# Patient Record
Sex: Male | Born: 1995 | Race: White | Hispanic: No | Marital: Single | Smoking: Never smoker
Health system: Southern US, Community
[De-identification: ages and names within clinical notes are randomized; demographics above are authoritative.]

---

## 2017-01-02 ENCOUNTER — Emergency Department: Payer: Commercial Managed Care - PPO

## 2017-01-02 ENCOUNTER — Emergency Department
Admission: EM | Admit: 2017-01-02 | Discharge: 2017-01-02 | Disposition: A | Discharge status: Home / Self Care | Payer: Commercial Managed Care - PPO | Attending: Emergency Medicine | Admitting: Emergency Medicine

## 2017-01-02 DIAGNOSIS — R197 Diarrhea, unspecified: Secondary | ICD-10-CM | POA: Insufficient documentation

## 2017-01-02 DIAGNOSIS — R5381 Other malaise: Secondary | ICD-10-CM | POA: Diagnosis not present

## 2017-01-02 DIAGNOSIS — R1084 Generalized abdominal pain: Secondary | ICD-10-CM | POA: Insufficient documentation

## 2017-01-02 DIAGNOSIS — R112 Nausea with vomiting, unspecified: Secondary | ICD-10-CM | POA: Diagnosis present

## 2017-01-02 LAB — CBC
HCT: 41.9 % (ref 40.0–52.0)
Hemoglobin: 13.9 g/dL (ref 13.0–18.0)
MCH: 25.9 pg — AB (ref 26.0–34.0)
MCHC: 33.2 g/dL (ref 32.0–36.0)
MCV: 78 fL — AB (ref 80.0–100.0)
PLATELETS: 524 10*3/uL — AB (ref 150–440)
RBC: 5.37 MIL/uL (ref 4.40–5.90)
RDW: 12.8 % (ref 11.5–14.5)
WBC: 15.1 10*3/uL — ABNORMAL HIGH (ref 3.8–10.6)

## 2017-01-02 LAB — LIPASE, BLOOD: LIPASE: 17 U/L (ref 11–51)

## 2017-01-02 LAB — URINALYSIS, COMPLETE (UACMP) WITH MICROSCOPIC
Bacteria, UA: NONE SEEN
Bilirubin Urine: NEGATIVE
Glucose, UA: NEGATIVE mg/dL
Hgb urine dipstick: NEGATIVE
KETONES UR: NEGATIVE mg/dL
Leukocytes, UA: NEGATIVE
Nitrite: NEGATIVE
PH: 6 (ref 5.0–8.0)
PROTEIN: NEGATIVE mg/dL
SQUAMOUS EPITHELIAL / LPF: NONE SEEN
Specific Gravity, Urine: 1.018 (ref 1.005–1.030)

## 2017-01-02 LAB — COMPREHENSIVE METABOLIC PANEL
ALBUMIN: 4.3 g/dL (ref 3.5–5.0)
ALK PHOS: 100 U/L (ref 38–126)
ALT: 27 U/L (ref 17–63)
AST: 29 U/L (ref 15–41)
Anion gap: 8 (ref 5–15)
BUN: 17 mg/dL (ref 6–20)
CALCIUM: 9.8 mg/dL (ref 8.9–10.3)
CHLORIDE: 104 mmol/L (ref 101–111)
CO2: 26 mmol/L (ref 22–32)
CREATININE: 1.15 mg/dL (ref 0.61–1.24)
GFR calc non Af Amer: >60 mL/min (ref >60)
GLUCOSE: 96 mg/dL (ref 65–99)
Potassium: 4.2 mmol/L (ref 3.5–5.1)
SODIUM: 138 mmol/L (ref 135–145)
Total Bilirubin: 0.6 mg/dL (ref 0.3–1.2)
Total Protein: 8.2 g/dL — ABNORMAL HIGH (ref 6.5–8.1)

## 2017-01-02 LAB — MONONUCLEOSIS SCREEN: Mono Screen: NEGATIVE

## 2017-01-02 MED ORDER — IOPAMIDOL (ISOVUE-300) INJECTION 61%
100.0000 mL | Freq: Once | INTRAVENOUS | Status: AC | PRN
  Administered 2017-01-02: 100 mL via INTRAVENOUS

## 2017-01-02 MED ORDER — IOPAMIDOL (ISOVUE-300) INJECTION 61%
30.0000 mL | Freq: Once | INTRAVENOUS | Status: AC | PRN
  Administered 2017-01-02: 30 mL via ORAL

## 2017-01-02 MED ORDER — SODIUM CHLORIDE 0.9 % IV BOLUS (SEPSIS)
1000.0000 mL | Freq: Once | INTRAVENOUS | Status: AC
  Administered 2017-01-02: 1000 mL via INTRAVENOUS

## 2017-01-02 MED ORDER — ONDANSETRON HCL 4 MG/2ML IJ SOLN
4.0000 mg | Freq: Once | INTRAMUSCULAR | Status: AC
  Administered 2017-01-02: 4 mg via INTRAVENOUS
  Filled 2017-01-02: qty 2

## 2017-01-02 MED ORDER — ONDANSETRON HCL 4 MG PO TABS: 4.0000 mg | ORAL_TABLET | Freq: Every day | ORAL | 0 refills | Status: AC | PRN

## 2017-01-02 MED ORDER — ONDANSETRON HCL 4 MG PO TABS: 4.0000 mg | ORAL_TABLET | Freq: Once | ORAL | Status: DC

## 2017-01-02 NOTE — ED Triage Notes (Addendum)
Pt reports nausea and abdominal pain X 1 week, generalized fatigue. Pt alert and oriented X4, active, cooperative, pt in NAD. RR even and unlabored, color WNL.  Nausea and fatigue worse after eating. PO intake normal.

## 2017-01-02 NOTE — ED Notes (Signed)
Patient up to restroom with no assistance. Steady gait noted. Will continue to monitor.

## 2017-01-02 NOTE — ED Provider Notes (Signed)
[facility] Emergency Department Provider Note  ____________________________________________   None    (approximate)  I have reviewed the triage vital signs and the nursing notes.   HISTORY  Chief Complaint Abdominal Pain and Nausea   HPI Alejandro Gross is a 21 y.o. male who is presenting to the emergency department with 1 week of nausea, fatigue as well as abdominal and right testicular pain. He says that he has been feeling increasingly weak without any focal weakness. Says that after eating now he becomes nauseous and then vomits. He says that he has intermittent abdominal pain but there are shooting pains that last for seconds. He says the pain is in his right testicle. He says that he had had similar pains this past fall [facility] and was diagnosed with epididymitis. He denies any urinary symptoms at this time. Denies any sore throat, body aches or fever.  Says that when the abdominal pain does occur it is to the bilateral lower abdomen.   History reviewed. No pertinent past medical history.  There are no active problems to display for this patient.   History reviewed. No pertinent surgical history.  Prior to Admission medications   Not on File    Allergies Patient has no known allergies.  No family history on file.  Social History Social History  Substance Use Topics  . Smoking status: Never Smoker  . Smokeless tobacco: Never Used  . Alcohol use Yes    Review of Systems Constitutional: No fever/chills Eyes: No visual changes. ENT: No sore throat. Cardiovascular: Denies chest pain. Respiratory: Denies shortness of breath. Gastrointestinal:   No diarrhea.  No constipation. Genitourinary: Negative for dysuria. Musculoskeletal: Negative for back pain. Skin: Negative for rash. Neurological: Negative for headaches, focal weakness or numbness.  10-point ROS otherwise  negative.  ____________________________________________   PHYSICAL EXAM:  VITAL SIGNS: ED Triage Vitals  Enc Vitals Group     BP 01/02/17 1238 (!) 141/72     Pulse Rate 01/02/17 1238 64     Resp 01/02/17 1238 12     Temp 01/02/17 1238 98.2 F (36.8 C)     Temp Source 01/02/17 1238 Oral     SpO2 01/02/17 1238 99 %     Weight 01/02/17 1238 180 lb (81.6 kg)     Height 01/02/17 1238 5\' 8"  (1.727 m)     Head Circumference --      Peak Flow --      Pain Score 01/02/17 1243 0     Pain Loc --      Pain Edu? --      Excl. in GC? --     Constitutional: Alert and oriented. Well appearing and in no acute distress. Eyes: Conjunctivae are normal. PERRL. EOMI. Head: Atraumatic. Nose: No congestion/rhinnorhea. Mouth/Throat: Mucous membranes are moist.  Small amount of exudate to the tonsils, bilaterally. No swelling or erythema. Neck: No stridor.   Cardiovascular: Normal rate, regular rhythm. Grossly normal heart sounds.  Respiratory: Normal respiratory effort.  No retractions. Lungs CTAB. Gastrointestinal: Soft and nontender. No distention. No abdominal bruits. No CVA tenderness. Genitourinary: Normal external genitalia in this circumcised male. No tenderness to palpation to the testes or epididymitis, bilaterally. No swelling or induration. No hernia sacs palpated. Musculoskeletal: No lower extremity tenderness nor edema.  No joint effusions. Neurologic:  Normal speech and language. No gross focal neurologic deficits are appreciated. No gait instability. Skin:  Skin is warm, dry and intact. No rash noted.  Psychiatric: Mood and affect are normal. Speech and behavior are normal.  ____________________________________________   LABS (all labs ordered are listed, but only abnormal results are displayed)  Labs Reviewed  COMPREHENSIVE METABOLIC PANEL - Abnormal; Notable for the following:       Result Value   Total Protein 8.2 (*)    All other components within normal limits  CBC -  Abnormal; Notable for the following:    WBC 15.1 (*)    MCV 78.0 (*)    MCH 25.9 (*)    Platelets 524 (*)    All other components within normal limits  URINALYSIS, COMPLETE (UACMP) WITH MICROSCOPIC - Abnormal; Notable for the following:    Color, Urine YELLOW (*)    APPearance CLEAR (*)    All other components within normal limits  LIPASE, BLOOD  MONONUCLEOSIS SCREEN   ____________________________________________  EKG   ____________________________________________  RADIOLOGY    CT Abdomen Pelvis W Contrast (Final result)  Result time 01/02/17 17:15:51  Final result by Myles Rosenthal, MD (01/02/17 17:15:51)           Narrative:   CLINICAL DATA: Abdominal pain and nausea for 1 week. Leukocytosis.  EXAM: CT ABDOMEN AND PELVIS WITH CONTRAST  TECHNIQUE: Multidetector CT imaging of the abdomen and pelvis was performed using the standard protocol following bolus administration of intravenous contrast.  CONTRAST: ISOVUE-300 IOPAMIDOL (ISOVUE-300) INJECTION 61%  COMPARISON: None.  FINDINGS: Lower Chest: No acute findings.  Hepatobiliary: No masses identified. Gallbladder is unremarkable.  Pancreas: No mass or inflammatory changes.  Spleen: Within normal limits in size and appearance.  Adrenals/Urinary Tract: No masses identified. No evidence of hydronephrosis.  Stomach/Bowel: No evidence of obstruction, inflammatory process or abnormal fluid collections. Normal appendix visualized.  Vascular/Lymphatic: No pathologically enlarged lymph nodes. No abdominal aortic aneurysm.  Reproductive: No mass identified.  Other: None.  Musculoskeletal: No suspicious bone lesions identified.  IMPRESSION: Negative. No acute findings or other significant abnormality identified.   Electronically Signed By: Myles Rosenthal M.D. On: 01/02/2017 17:15          ____________________________________________   PROCEDURES  Procedure(s) performed:    Procedures  Critical Care performed:   ____________________________________________   INITIAL IMPRESSION / ASSESSMENT AND PLAN / ED COURSE  Pertinent labs & imaging results that were available during my care of the patient were reviewed by me and considered in my medical decision making (see chart for details).  ----------------------------------------- 6:22 PM on 01/02/2017 -----------------------------------------  Patient with a CAT scan without any acute findings. After Zofran the patient has been able to tolerate his by mouth contrast. Has also had one episode of diarrhea in the emergency department. Reassuring workup except for his white blood cell count of 15. Possible viral etiology. Symptoms have been ongoing for a week. Now with nausea vomiting and diarrhea. He'll be discharged with Zofran. He'll be following up at the [facility] clinic. He is understanding the plan and willing to comply. Denies any known tick bites or fevers.      ____________________________________________   FINAL CLINICAL IMPRESSION(S) / ED DIAGNOSES  Malaise. Abdominal pain. Nausea vomiting and diarrhea.    NEW MEDICATIONS STARTED DURING THIS VISIT:  New Prescriptions   No medications on file     Note:  This document was prepared using Dragon voice recognition software and may include unintentional dictation errors.    Myrna Blazer, MD 01/02/17 [id number]

## 2017-08-01 IMAGING — CT CT ABD-PELV W/ CM
2 of 4 series · 16 of 46 positions shown, 18 images · IV contrast (iopamidol)
Comparison: None.

CLINICAL DATA: Abdominal pain and nausea for 1 week.  Leukocytosis.

EXAM:
CT ABDOMEN AND PELVIS WITH CONTRAST
TECHNIQUE: Multidetector CT imaging of the abdomen and pelvis was performed
using the standard protocol following bolus administration of
intravenous contrast.
CONTRAST:  100mL [id number] IOPAMIDOL ([id number]) INJECTION 61%

[Series 2: routine abd/pel with · axial · 0.79mm/px · z∈[-943,-518]mm · 13 of 93 slices shown, 15 images]
[im 4/93  soft-tissue]
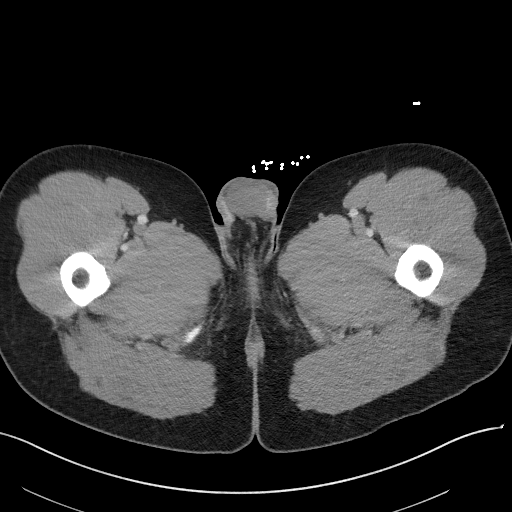
[im 4/93  bone]
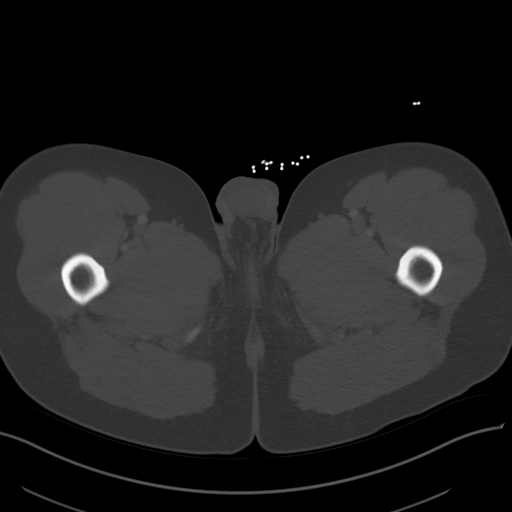
[im 12/93  soft-tissue]
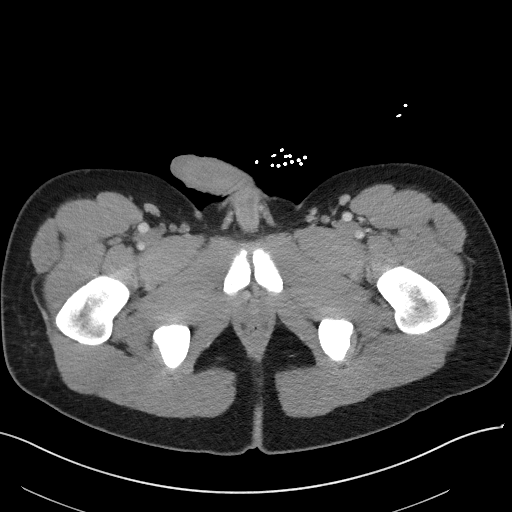
[im 20/93  soft-tissue]
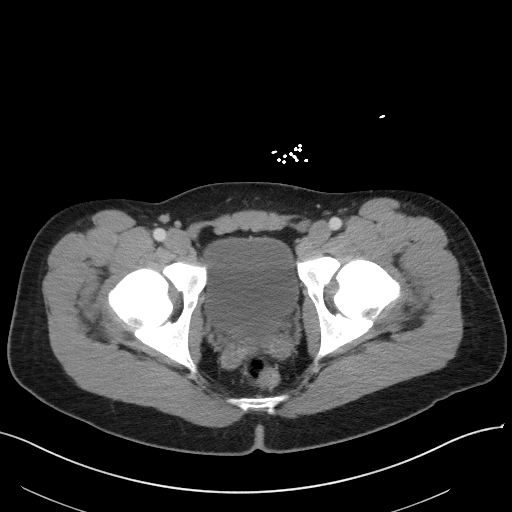
[im 27/93  soft-tissue]
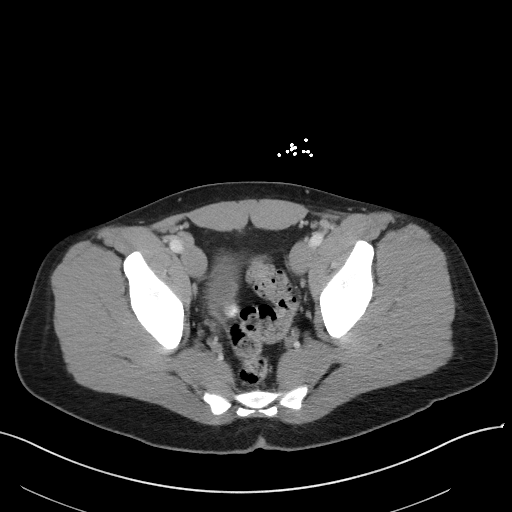
[im 31/93  soft-tissue]
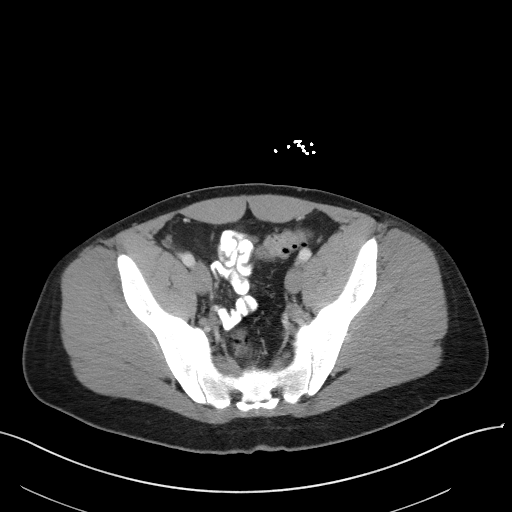
[im 39/93  soft-tissue]
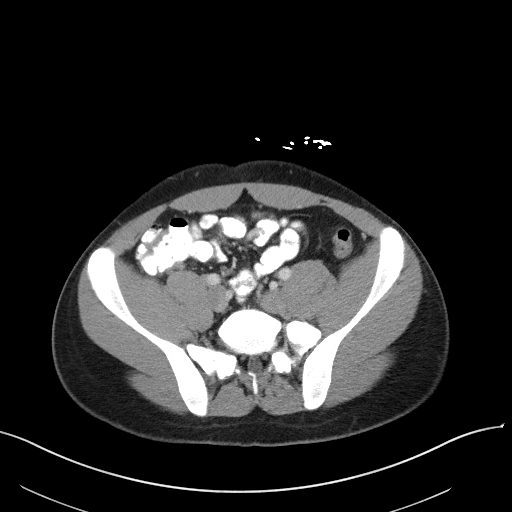
[im 47/93  soft-tissue]
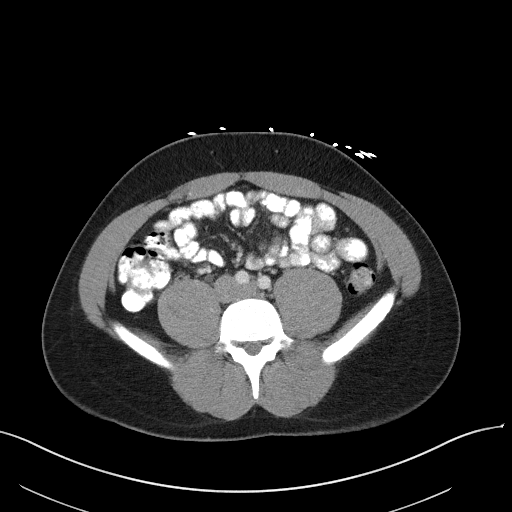
[im 54/93  soft-tissue]
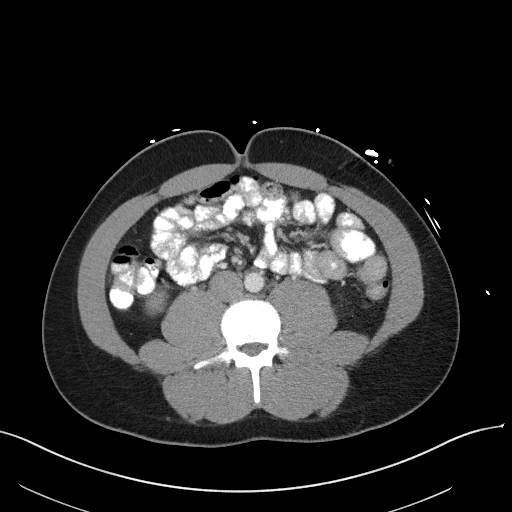
[im 62/93  soft-tissue]
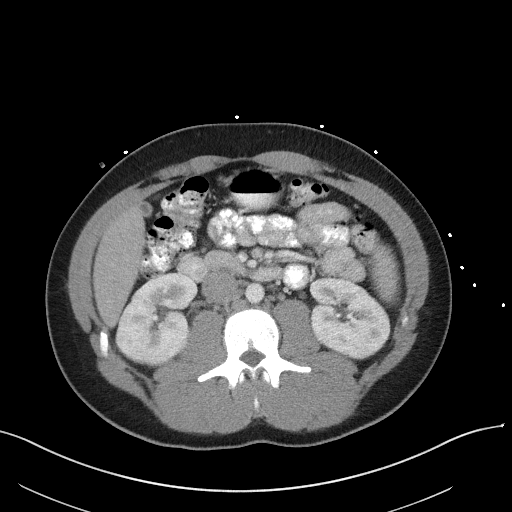
[im 62/93  bone]
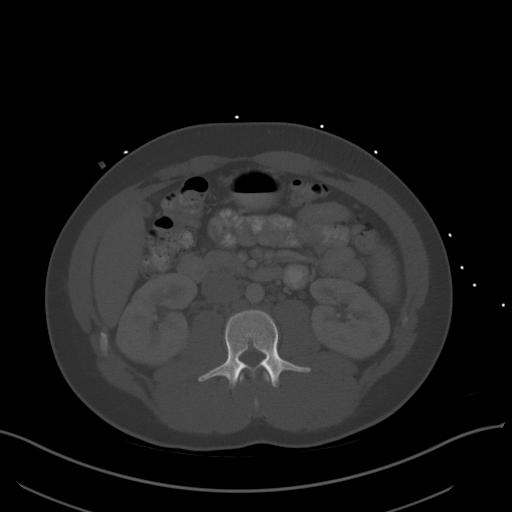
[im 66/93  soft-tissue]
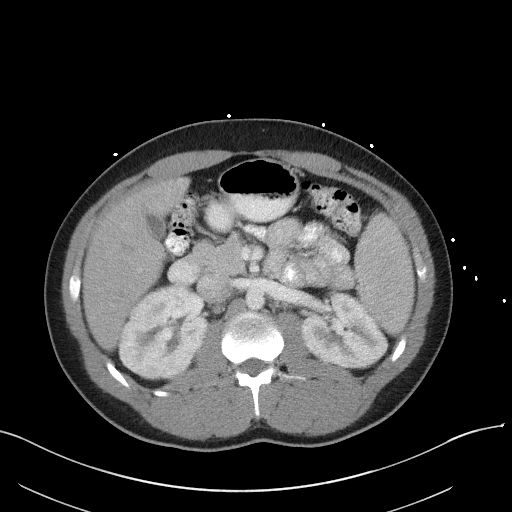
[im 73/93  soft-tissue]
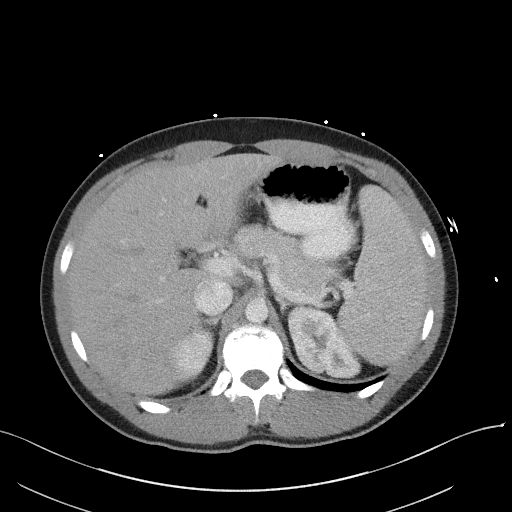
[im 81/93  soft-tissue]
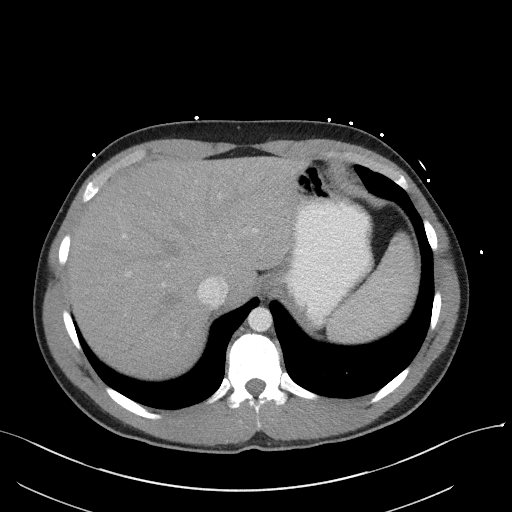
[im 89/93  soft-tissue]
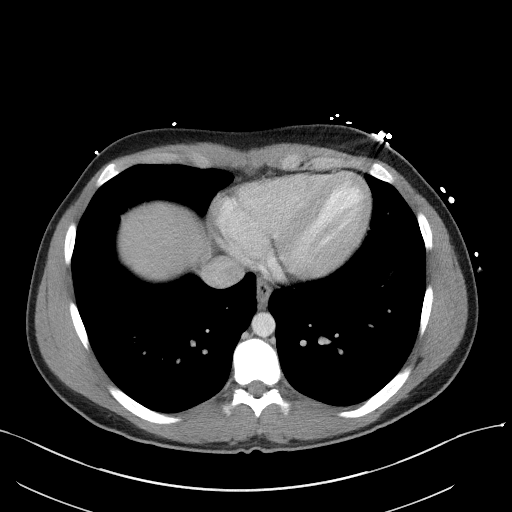

[Series 5: coronal st · coronal · 0.68mm/px · 3 of 101 slices shown]
[im 34/101  soft-tissue]
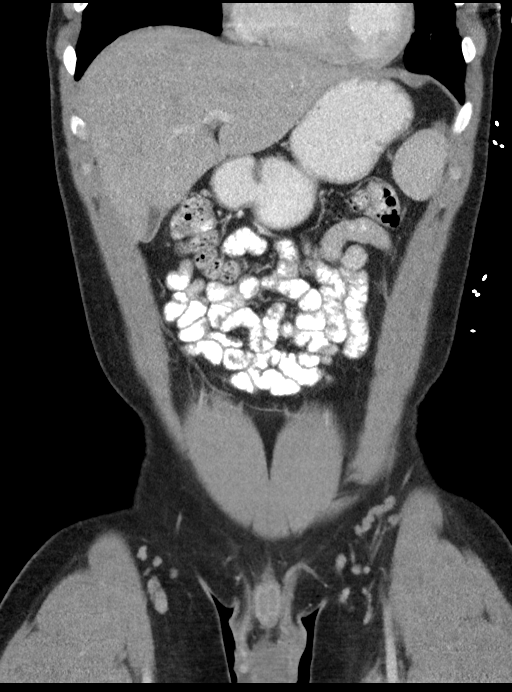
[im 45/101  soft-tissue]
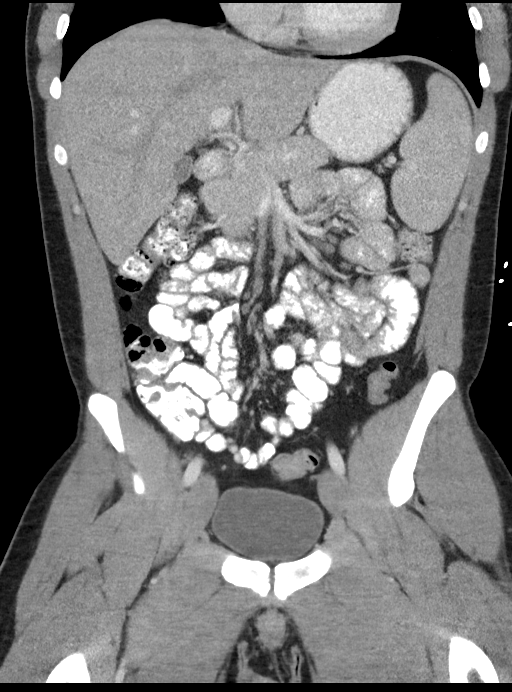
[im 56/101  soft-tissue]
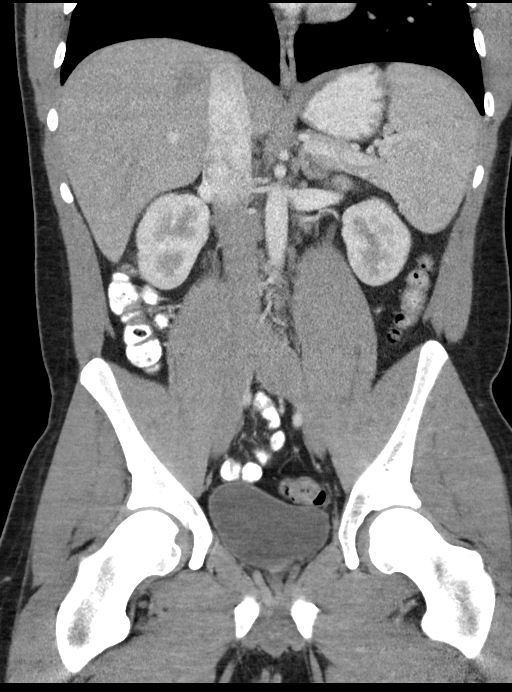

[16 of 46 positions shown; findings below may reference images not displayed]

FINDINGS: Lower Chest: No acute findings.

Hepatobiliary:  No masses identified. Gallbladder is unremarkable.

Pancreas:  No mass or inflammatory changes.

Spleen: Within normal limits in size and appearance.

Adrenals/Urinary Tract: No masses identified. No evidence of
hydronephrosis.

Stomach/Bowel: No evidence of obstruction, inflammatory process or
abnormal fluid collections. Normal appendix visualized.

Vascular/Lymphatic: No pathologically enlarged lymph nodes. No
abdominal aortic aneurysm.

Reproductive:  No mass identified.

Other:  None.

Musculoskeletal:  No suspicious bone lesions identified.
IMPRESSION: Negative. No acute findings or other significant abnormality
identified.
